# Patient Record
Sex: Female | Born: 1965 | Race: White | Hispanic: No | Marital: Single | Smoking: Former smoker
Health system: Southern US, Community
[De-identification: ages and names within clinical notes are randomized; demographics above are authoritative.]

## PROBLEM LIST (undated history)

## (undated) DIAGNOSIS — F419 Anxiety disorder, unspecified: Secondary | ICD-10-CM

## (undated) DIAGNOSIS — M47812 Spondylosis without myelopathy or radiculopathy, cervical region: Secondary | ICD-10-CM

## (undated) DIAGNOSIS — K2270 Barrett's esophagus without dysplasia: Secondary | ICD-10-CM

## (undated) DIAGNOSIS — Z8719 Personal history of other diseases of the digestive system: Secondary | ICD-10-CM

## (undated) DIAGNOSIS — K219 Gastro-esophageal reflux disease without esophagitis: Secondary | ICD-10-CM

## (undated) DIAGNOSIS — K5792 Diverticulitis of intestine, part unspecified, without perforation or abscess without bleeding: Secondary | ICD-10-CM

## (undated) DIAGNOSIS — J40 Bronchitis, not specified as acute or chronic: Secondary | ICD-10-CM

## (undated) HISTORY — PX: TUBAL LIGATION: SHX77

## (undated) HISTORY — PX: ABDOMINAL HYSTERECTOMY: SHX81

## (undated) HISTORY — PX: CARPAL TUNNEL RELEASE: SHX101

## (undated) HISTORY — PX: TONSILLECTOMY: SUR1361

## (undated) HISTORY — PX: BREAST SURGERY: SHX581

## (undated) HISTORY — PX: COLONOSCOPY WITH ESOPHAGOGASTRODUODENOSCOPY (EGD): SHX5779

---

## 2001-03-13 ENCOUNTER — Encounter: Payer: Self-pay | Admitting: Emergency Medicine

## 2001-03-13 ENCOUNTER — Emergency Department (HOSPITAL_COMMUNITY): Admission: EM | Admit: 2001-03-13 | Discharge: 2001-03-13 | Payer: Self-pay | Admitting: Emergency Medicine

## 2005-05-28 ENCOUNTER — Emergency Department (HOSPITAL_COMMUNITY): Admission: EM | Admit: 2005-05-28 | Discharge: 2005-05-28 | Payer: Self-pay | Admitting: Family Medicine

## 2013-02-09 ENCOUNTER — Emergency Department (HOSPITAL_COMMUNITY): Payer: No Typology Code available for payment source

## 2013-02-09 ENCOUNTER — Emergency Department (HOSPITAL_COMMUNITY)
Admission: EM | Admit: 2013-02-09 | Discharge: 2013-02-09 | Disposition: A | Discharge status: Home / Self Care | Payer: No Typology Code available for payment source | Attending: Emergency Medicine | Admitting: Emergency Medicine

## 2013-02-09 ENCOUNTER — Encounter (HOSPITAL_COMMUNITY): Payer: Self-pay | Admitting: Emergency Medicine

## 2013-02-09 DIAGNOSIS — S82009A Unspecified fracture of unspecified patella, initial encounter for closed fracture: Secondary | ICD-10-CM | POA: Insufficient documentation

## 2013-02-09 DIAGNOSIS — S0993XA Unspecified injury of face, initial encounter: Secondary | ICD-10-CM | POA: Insufficient documentation

## 2013-02-09 DIAGNOSIS — S298XXA Other specified injuries of thorax, initial encounter: Secondary | ICD-10-CM | POA: Insufficient documentation

## 2013-02-09 DIAGNOSIS — S82001A Unspecified fracture of right patella, initial encounter for closed fracture: Secondary | ICD-10-CM

## 2013-02-09 DIAGNOSIS — F17200 Nicotine dependence, unspecified, uncomplicated: Secondary | ICD-10-CM | POA: Insufficient documentation

## 2013-02-09 DIAGNOSIS — F411 Generalized anxiety disorder: Secondary | ICD-10-CM | POA: Insufficient documentation

## 2013-02-09 DIAGNOSIS — Y92410 Unspecified street and highway as the place of occurrence of the external cause: Secondary | ICD-10-CM | POA: Insufficient documentation

## 2013-02-09 DIAGNOSIS — Z8679 Personal history of other diseases of the circulatory system: Secondary | ICD-10-CM | POA: Insufficient documentation

## 2013-02-09 DIAGNOSIS — Y9389 Activity, other specified: Secondary | ICD-10-CM | POA: Insufficient documentation

## 2013-02-09 HISTORY — DX: Gastro-esophageal reflux disease without esophagitis: K21.9

## 2013-02-09 HISTORY — DX: Anxiety disorder, unspecified: F41.9

## 2013-02-09 MED ORDER — LORAZEPAM 1 MG PO TABS
1.0000 mg | ORAL_TABLET | Freq: Once | ORAL | Status: AC
  Administered 2013-02-09: 1 mg via ORAL
  Filled 2013-02-09: qty 1

## 2013-02-09 MED ORDER — KETOROLAC TROMETHAMINE 60 MG/2ML IM SOLN
60.0000 mg | Freq: Once | INTRAMUSCULAR | Status: AC
  Administered 2013-02-09: 60 mg via INTRAMUSCULAR
  Filled 2013-02-09: qty 2

## 2013-02-09 MED ORDER — HYDROMORPHONE HCL PF 1 MG/ML IJ SOLN
1.0000 mg | Freq: Once | INTRAMUSCULAR | Status: AC
  Administered 2013-02-09: 1 mg via INTRAMUSCULAR
  Filled 2013-02-09: qty 1

## 2013-02-09 MED ORDER — PROMETHAZINE HCL 25 MG PO TABS: 25.0000 mg | ORAL_TABLET | Freq: Four times a day (QID) | ORAL | Status: DC | PRN

## 2013-02-09 MED ORDER — OXYCODONE-ACETAMINOPHEN 5-325 MG PO TABS: 2.0000 | ORAL_TABLET | ORAL | Status: DC | PRN

## 2013-02-09 NOTE — ED Notes (Signed)
Sensation equal to bilateral extremities. Hand grips equal and strong. Denies any numbness tingling. States the back of her head hurts so bad it is almost numb.

## 2013-02-09 NOTE — ED Notes (Signed)
Ortho called for placement of knee immobilizer and crutches.
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
## 2013-02-09 NOTE — Progress Notes (Signed)
Orthopedic Tech Progress Note Patient Details:  Paula Casey [birth year] [id number]  Ortho Devices Type of Ortho Device: Knee Immobilizer;Crutches Ortho Device/Splint Location: RLE Ortho Device/Splint Interventions: Ordered;Application   Jennye Moccasin 02/09/2013, 11:14 PM
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
## 2013-02-09 NOTE — ED Notes (Signed)
C Spine collar removed by MD. Imaging results discussed with patient and family, verbalizes understanding, discharge instructions, and follow up.

## 2013-02-09 NOTE — ED Notes (Signed)
Pt. Crying out saying "I can't be alone, I can't be alone". Family brought back to see patient. Pt. Very anxious.
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
## 2015-05-13 ENCOUNTER — Emergency Department (INDEPENDENT_AMBULATORY_CARE_PROVIDER_SITE_OTHER)
Admission: EM | Admit: 2015-05-13 | Discharge: 2015-05-13 | Disposition: A | Discharge status: Home / Self Care | Payer: Medicaid Other | Source: Home / Self Care | Attending: Emergency Medicine | Admitting: Emergency Medicine

## 2015-05-13 ENCOUNTER — Encounter (HOSPITAL_COMMUNITY): Payer: Self-pay | Admitting: Emergency Medicine

## 2015-05-13 ENCOUNTER — Emergency Department (INDEPENDENT_AMBULATORY_CARE_PROVIDER_SITE_OTHER): Payer: Medicaid Other

## 2015-05-13 DIAGNOSIS — S93402A Sprain of unspecified ligament of left ankle, initial encounter: Secondary | ICD-10-CM

## 2015-05-13 DIAGNOSIS — S92102A Unspecified fracture of left talus, initial encounter for closed fracture: Secondary | ICD-10-CM | POA: Diagnosis not present

## 2015-05-13 MED ORDER — HYDROCODONE-ACETAMINOPHEN 5-325 MG PO TABS: 1.0000 | ORAL_TABLET | ORAL | Status: DC | PRN

## 2015-05-13 NOTE — ED Notes (Signed)
Reports she twisted her left ankle this am when walking down the bleachers during son's basketball game Sx include swelling and unable to bear wt... Reports she heard a "crack" Brought back in wheelchair A&O x4... No acute distress.

## 2015-05-13 NOTE — Discharge Instructions (Signed)
Ankle Sprain °An ankle sprain is an injury to the strong, fibrous tissues (ligaments) that hold the bones of your ankle joint together.  °CAUSES °An ankle sprain is usually caused by a fall or by twisting your ankle. Ankle sprains most commonly occur when you step on the outer edge of your foot, and your ankle turns inward. People who participate in sports are more prone to these types of injuries.  °SYMPTOMS  °· Pain in your ankle. The pain may be present at rest or only when you are trying to stand or walk. °· Swelling. °· Bruising. Bruising may develop immediately or within 1 to 2 days after your injury. °· Difficulty standing or walking, particularly when turning corners or changing directions. °DIAGNOSIS  °Your caregiver will ask you details about your injury and perform a physical exam of your ankle to determine if you have an ankle sprain. During the physical exam, your caregiver will press on and apply pressure to specific areas of your foot and ankle. Your caregiver will try to move your ankle in certain ways. An X-ray exam may be done to be sure a bone was not broken or a ligament did not separate from one of the bones in your ankle (avulsion fracture).  °TREATMENT  °Certain types of braces can help stabilize your ankle. Your caregiver can make a recommendation for this. Your caregiver may recommend the use of medicine for pain. If your sprain is severe, your caregiver may refer you to a surgeon who helps to restore function to parts of your skeletal system (orthopedist) or a physical therapist. °HOME CARE INSTRUCTIONS  °· Apply ice to your injury for 1-2 days or as directed by your caregiver. Applying ice helps to reduce inflammation and pain. °· Put ice in a plastic bag. °· Place a towel between your skin and the bag. °· Leave the ice on for 15-20 minutes at a time, every 2 hours while you are awake. °· Only take over-the-counter or prescription medicines for pain, discomfort, or fever as directed by  your caregiver. °· Elevate your injured ankle above the level of your heart as much as possible for 2-3 days. °· If your caregiver recommends crutches, use them as instructed. Gradually put weight on the affected ankle. Continue to use crutches or a cane until you can walk without feeling pain in your ankle. °· If you have a plaster splint, wear the splint as directed by your caregiver. Do not rest it on anything harder than a pillow for the first 24 hours. Do not put weight on it. Do not get it wet. You may take it off to take a shower or bath. °· You may have been given an elastic bandage to wear around your ankle to provide support. If the elastic bandage is too tight (you have numbness or tingling in your foot or your foot becomes cold and blue), adjust the bandage to make it comfortable. °· If you have an air splint, you may blow more air into it or let air out to make it more comfortable. You may take your splint off at night and before taking a shower or bath. Wiggle your toes in the splint several times per day to decrease swelling. °SEEK MEDICAL CARE IF:  °· You have rapidly increasing bruising or swelling. °· Your toes feel extremely cold or you lose feeling in your foot. °· Your pain is not relieved with medicine. °SEEK IMMEDIATE MEDICAL CARE IF: °· Your toes are numb or blue. °·   You have severe pain that is increasing. MAKE SURE YOU:   Understand these instructions.  Will watch your condition.  Will get help right away if you are not doing well or get worse.   This information is not intended to replace advice given to you by your health care provider. Make sure you discuss any questions you have with your health care provider.   Document Released: 05/13/2005 Document Revised: 06/03/2014 Document Reviewed: 05/25/2011 Elsevier Interactive Patient Education 2016 [employer]  Tarsal Fracture With Rehab The tarsal bones are a collection of 7 bones that collectively make up the back half of  the foot (hind foot). A tarsal fracture is a break in one of these bones. The 7 tarsal bones are the ankle bone (talus), heel bone (calcaneus), the 4 cuneiform bones, the cuboid, and the navicular. SYMPTOMS   Severe pain at the time of injury, which may persist for several weeks.  Tenderness, inflammation, or bruising (contusion) at the fracture site.  Swelling within the foot, causing numbness and paralysis, which are signs of nerve and blood vessel damage (uncommon). CAUSES  Tarsal fractures occur when a force is placed on the bone that is greater than the bone can withstand. Common causes of injury include:  Direct trauma (injury from impact) to the foot.  Awkwardly landing on the foot after jumping.  Twisting injury to the ankle and foot. RISK INCREASES WITH:  Contact sports (football, rugby, or lacrosse).  Jumping sports (basketball or volleyball).  Previous ankle or foot injury.  Poor strength and flexibility. PREVENTION  Warm up and stretch properly before activity.  Maintain physical fitness:  Strength, flexibility, and endurance.  Cardiovascular fitness (increases heart rate).  Protect the ankle and foot with taping, braces, or compression bandages.  Wear properly fitted shoes that are appropriate for the activity. PROGNOSIS  If treated properly, tarsal fractures can be expected to heal with non-surgical treatment. Occasionally, surgery is necessary to treat bone fragments that are out of alignment (displaced fracture). RELATED COMPLICATIONS   Failure of the fracture to heal (nonunion).  Healing of the fracture in a poor position (malunion).  Recurring symptoms that result in a chronic problem.  Prolonged healing time, if improperly treated or re-injured.  Bone death, due to poor circulation to the fracture site (rare).  Arthritis of the foot. TREATMENT  Treatment first involves the use of ice and medicine, to reduce pain and inflammation. If the bone  fragments are out of alignment (displaced fracture), then the bone must be immediately realigned (reduction) by a trained professional. Fractures that cannot be realigned by hand, or fractures where the bones poke out through the skin (open fracture), may require surgery to hold the fracture in place with screws, pins, and plates. Once in proper alignment, the foot and ankle must be kept still for a period of time to allow for healing. After this period, it is important to perform strengthening and stretching exercises to help regain strength and a full range of motion. These exercises may be completed at home or with a therapist.  MEDICATION   If pain medicine is necessary, nonsteroidal anti-inflammatory medications (aspirin and ibuprofen) or other minor pain relievers (acetaminophen) are often recommended.  Do not take pain medication for 7 days before surgery.  Prescription pain relievers may be given if your caregiver thinks they are necessary. Use only as directed and only as much as you need. COLD THERAPY  Cold treatment (icing) relieves pain and reduces inflammation. Cold treatment should be  applied for 10 to 15 minutes every 2 to 3 hours, and immediately after any activity that aggravates your symptoms. Use ice packs or an ice massage. SEEK MEDICAL CARE IF:   Treatment does not seem to help, or the condition worsens.  Any medicines produce negative side effects.  Any complications from surgery occur:  Pain, numbness, or coldness in the affected foot.  Discoloration beneath the toenails (blue or gray) of the affected foot.  Signs of infections (fever, pain, inflammation, redness, or persistent bleeding).

## 2015-05-13 NOTE — ED Provider Notes (Signed)
CSN: [id number]     Arrival date & time 05/13/15  1423 History   First MD Initiated Contact with Patient 05/13/15 1458     Chief Complaint  Patient presents with  . Ankle Injury   (Consider location/radiation/quality/duration/timing/severity/associated sxs/prior Treatment) HPI Comments: 49 year old female was walking up bleachers at a sports event and her left foot got cold in between the steps and twisted her foot/ankle. This occurred approximate 45 minutes prior to arrival. She is complaining of pain primarily to the lateral aspect of the left ankle where there is swelling and tenderness. Is also complaining of numbness and coldness in her foot.   Past Medical History  Diagnosis Date  . Anxiety   . GERD (gastroesophageal reflux disease)    Past Surgical History  Procedure Laterality Date  . Abdominal hysterectomy     No family history on file. Social History  Substance Use Topics  . Smoking status: Current Every Day Smoker -- 0.50 packs/day    Types: Cigarettes  . Smokeless tobacco: None  . Alcohol Use: No   OB History    No data available     Review of Systems  Constitutional: Positive for activity change. Negative for fever and chills.  HENT: Negative.   Respiratory: Negative.   Musculoskeletal: Positive for joint swelling and arthralgias. Negative for myalgias, neck pain and neck stiffness.       As per HPI  Skin: Negative for color change, pallor and rash.  Neurological: Positive for numbness.    Allergies  Codeine  Home Medications   Prior to Admission medications   Medication Sig Start Date End Date Taking? Authorizing Provider  amoxicillin-clavulanate (AUGMENTIN) 875-125 MG tablet Take 1 tablet by mouth 2 (two) times daily.   Yes Historical Provider, MD  BuPROPion HCl (WELLBUTRIN PO) Take by mouth.   Yes Historical Provider, MD  pantoprazole (PROTONIX) 20 MG tablet Take 20 mg by mouth daily.   Yes Historical Provider, MD  ALPRAZolam Prudy Feeler) 1 MG tablet  Take 1 mg by mouth 3 (three) times daily as needed for anxiety.    Historical Provider, MD  HYDROcodone-acetaminophen (NORCO/VICODIN) 5-325 MG tablet Take 1 tablet by mouth every 4 (four) hours as needed. 05/13/15   Hayden Rasmussen, NP  oxyCODONE-acetaminophen (PERCOCET) 5-325 MG per tablet Take 2 tablets by mouth every 4 (four) hours as needed for pain. 02/09/13   Donnetta Hutching, MD  promethazine (PHENERGAN) 25 MG tablet Take 1 tablet (25 mg total) by mouth every 6 (six) hours as needed for nausea. 02/09/13   Donnetta Hutching, MD   Meds Ordered and Administered this Visit  Medications - No data to display  BP 146/85 mmHg  Pulse 89  Temp(Src) 98.2 F (36.8 C) (Oral)  Resp 18  SpO2 96% No data found.   Physical Exam  Constitutional: She is oriented to person, place, and time. She appears well-developed and well-nourished. No distress.  Eyes: EOM are normal.  Neck: Normal range of motion. Neck supple.  Cardiovascular: Normal rate.   Pulmonary/Chest: Effort normal. No respiratory distress.  Musculoskeletal: She exhibits no edema.  Swelling and tenderness to the left lateral malleolus. Minimal tenderness to the proximal foot dorsal aspect. Tenderness over the anterior ankle. No tenderness to the metatarsals or toes. No discoloration. No deformity. Decreased dorsiflexion due to ankle pain. Pedal pulses 2+. Normal color and warmth.  Neurological: She is alert and oriented to person, place, and time. She exhibits normal muscle tone.  Skin: Skin is warm and dry.  Psychiatric:  She has a normal mood and affect. Her behavior is normal.  Nursing note and vitals reviewed.   ED Course  Procedures (including critical care time)  Labs Review Labs Reviewed - No data to display  Imaging Review Dg Ankle Complete Left  05/13/2015  CLINICAL DATA:  Fall from bleachers with lateral ankle pain, initial encounter EXAM: LEFT ANKLE COMPLETE - 3+ VIEW COMPARISON:  None. FINDINGS: Small avulsion fracture is noted  laterally with mild soft tissue swelling. It appears to arise from the lateral aspect of the talus. No other fractures are seen. IMPRESSION: Small avulsion from the lateral aspect of the talus. No other focal abnormality is noted. Electronically Signed   By: Alcide CleverMark  Lukens M.D.   On: 05/13/2015 15:38     Visual Acuity Review  Right Eye Distance:   Left Eye Distance:   Bilateral Distance:    Right Eye Near:   Left Eye Near:    Bilateral Near:         MDM   1. Ankle sprain, left, initial encounter   2. Talar fracture, left, closed, initial encounter    RICE Crutches Cam Walker [facility], call    Hayden Rasmussenavid Yanelly Cantrelle, NP 05/13/15 1607

## 2016-05-14 ENCOUNTER — Other Ambulatory Visit: Payer: Self-pay | Admitting: Neurosurgery

## 2016-05-22 ENCOUNTER — Inpatient Hospital Stay (HOSPITAL_COMMUNITY)
Admission: RE | Admit: 2016-05-22 | Discharge: 2016-05-22 | Disposition: A | Discharge status: Home / Self Care | Payer: Medicaid Other | Source: Ambulatory Visit

## 2016-05-22 NOTE — Pre-Procedure Instructions (Signed)
    Paula Casey  05/22/2016      [facility] - [place] - [address] [place] [address] Phone: [phone] Fax: [phone]    Your procedure is scheduled on Thursday, May 31, 2015  Report to [facility] Admitting at 10:15 A.M. ( per MD)  Call this number if you have problems the morning of surgery:  [phone]   Remember:  Do not eat food or drink liquids after midnight Wednesday, May 29, 2016  Take these medicines the morning of surgery with A SIP OF WATER : buPROPion (WELLBUTRIN XL),  pantoprazole (PROTONIX),  if needed: ALPRAZolam Prudy Feeler(XANAX) for anxiety Stop taking Aspirin, vitamins, fish oil and herbal medications. Do not take any NSAIDs ie: Ibuprofen, Advil, Naproxen, BC and Goody Powder or any medication containing Aspirin; stop Thursday, May 23, 2016  Do not wear jewelry, make-up or nail polish.  Do not wear lotions, powders, or perfumes, or deoderant.  Do not shave 48 hours prior to surgery.    Do not bring valuables to the hospital.  [facility] not responsible for any belongings or valuables.  Contacts, dentures or bridgework may not be worn into surgery.  Leave your suitcase in the car.  After surgery it may be brought to your room. For patients admitted to the hospital, discharge time will be determined by your treatment team. Patients discharged the day of surgery will not be allowed to drive home.  Special instructions: Shower the night before surgery and the morning of surgery with CHG. Please read over the following fact sheets that you were given. Pain Booklet, Coughing and Deep Breathing, MRSA Information and Surgical Site Infection Prevention

## 2016-05-23 ENCOUNTER — Encounter (HOSPITAL_COMMUNITY): Payer: Self-pay

## 2016-05-23 ENCOUNTER — Encounter (HOSPITAL_COMMUNITY)
Admission: RE | Admit: 2016-05-23 | Discharge: 2016-05-23 | Disposition: A | Discharge status: Home / Self Care | Payer: Medicaid Other | Source: Ambulatory Visit | Attending: Neurosurgery | Admitting: Neurosurgery

## 2016-05-23 DIAGNOSIS — Z01812 Encounter for preprocedural laboratory examination: Secondary | ICD-10-CM | POA: Diagnosis present

## 2016-05-23 HISTORY — DX: Bronchitis, not specified as acute or chronic: J40

## 2016-05-23 HISTORY — DX: Spondylosis without myelopathy or radiculopathy, cervical region: M47.812

## 2016-05-23 HISTORY — DX: Diverticulitis of intestine, part unspecified, without perforation or abscess without bleeding: K57.92

## 2016-05-23 HISTORY — DX: Personal history of other diseases of the digestive system: Z87.19

## 2016-05-23 HISTORY — DX: Barrett's esophagus without dysplasia: K22.70

## 2016-05-23 LAB — CBC
HEMATOCRIT: 36.6 % (ref 36.0–46.0)
HEMOGLOBIN: 12.1 g/dL (ref 12.0–15.0)
MCH: 30.4 pg (ref 26.0–34.0)
MCHC: 33.1 g/dL (ref 30.0–36.0)
MCV: 92 fL (ref 78.0–100.0)
Platelets: 344 10*3/uL (ref 150–400)
RBC: 3.98 MIL/uL (ref 3.87–5.11)
RDW: 12.9 % (ref 11.5–15.5)
WBC: 12.4 10*3/uL — ABNORMAL HIGH (ref 4.0–10.5)

## 2016-05-23 LAB — COMPREHENSIVE METABOLIC PANEL
ALT: 18 U/L (ref 14–54)
AST: 16 U/L (ref 15–41)
Albumin: 3.9 g/dL (ref 3.5–5.0)
Alkaline Phosphatase: 63 U/L (ref 38–126)
Anion gap: 12 (ref 5–15)
BUN: 11 mg/dL (ref 6–20)
CO2: 24 mmol/L (ref 22–32)
Calcium: 8.9 mg/dL (ref 8.9–10.3)
Chloride: 105 mmol/L (ref 101–111)
Creatinine, Ser: 0.84 mg/dL (ref 0.44–1.00)
GFR calc Af Amer: >60 mL/min (ref >60)
GFR calc non Af Amer: >60 mL/min (ref >60)
Glucose, Bld: 119 mg/dL — ABNORMAL HIGH (ref 65–99)
Potassium: 3.7 mmol/L (ref 3.5–5.1)
Sodium: 141 mmol/L (ref 135–145)
Total Bilirubin: 0.7 mg/dL (ref 0.3–1.2)
Total Protein: 6.7 g/dL (ref 6.5–8.1)

## 2016-05-23 LAB — TYPE AND SCREEN
ABO/RH(D): O POS
ANTIBODY SCREEN: NEGATIVE

## 2016-05-23 LAB — SURGICAL PCR SCREEN
MRSA, PCR: NEGATIVE
STAPHYLOCOCCUS AUREUS: NEGATIVE

## 2016-05-23 LAB — ABO/RH: ABO/RH(D): O POS

## 2016-05-23 NOTE — Progress Notes (Signed)
Pt denies SOB, chest pain, and being under the care of a cardiologist. Pt stated that a stress test was performed > 10 years ago but denies having a cardiac cath and echo. Revonda StandardAllison, PA, Anesthesia, advised that a CBC and CMP be drawn since pt takes Wellbutrin. Pt denies having any recent labs. Pt denies having an EKG and chest x ray within the last year.

## 2016-05-28 ENCOUNTER — Other Ambulatory Visit (HOSPITAL_COMMUNITY): Payer: Medicaid Other

## 2016-05-30 ENCOUNTER — Encounter (HOSPITAL_COMMUNITY)
Admission: RE | Disposition: A | Discharge status: Home / Self Care | Payer: Self-pay | Source: Ambulatory Visit | Attending: Neurosurgery

## 2016-05-30 ENCOUNTER — Encounter (HOSPITAL_COMMUNITY): Payer: Self-pay | Admitting: *Deleted

## 2016-05-30 ENCOUNTER — Observation Stay (HOSPITAL_COMMUNITY)
Admission: RE | Admit: 2016-05-30 | Discharge: 2016-05-31 | Disposition: A | Discharge status: Home / Self Care | Payer: Medicaid Other | Source: Ambulatory Visit | Attending: Neurosurgery | Admitting: Neurosurgery

## 2016-05-30 ENCOUNTER — Ambulatory Visit (HOSPITAL_COMMUNITY): Payer: Medicaid Other

## 2016-05-30 ENCOUNTER — Ambulatory Visit (HOSPITAL_COMMUNITY): Payer: Medicaid Other | Admitting: Critical Care Medicine

## 2016-05-30 ENCOUNTER — Ambulatory Visit (HOSPITAL_COMMUNITY): Payer: Medicaid Other | Admitting: Vascular Surgery

## 2016-05-30 DIAGNOSIS — F419 Anxiety disorder, unspecified: Secondary | ICD-10-CM | POA: Diagnosis not present

## 2016-05-30 DIAGNOSIS — Z885 Allergy status to narcotic agent status: Secondary | ICD-10-CM | POA: Insufficient documentation

## 2016-05-30 DIAGNOSIS — Z87891 Personal history of nicotine dependence: Secondary | ICD-10-CM | POA: Insufficient documentation

## 2016-05-30 DIAGNOSIS — M4722 Other spondylosis with radiculopathy, cervical region: Secondary | ICD-10-CM | POA: Diagnosis present

## 2016-05-30 DIAGNOSIS — M50122 Cervical disc disorder at C5-C6 level with radiculopathy: Secondary | ICD-10-CM | POA: Insufficient documentation

## 2016-05-30 DIAGNOSIS — Z90710 Acquired absence of both cervix and uterus: Secondary | ICD-10-CM | POA: Insufficient documentation

## 2016-05-30 DIAGNOSIS — K219 Gastro-esophageal reflux disease without esophagitis: Secondary | ICD-10-CM | POA: Insufficient documentation

## 2016-05-30 DIAGNOSIS — M1990 Unspecified osteoarthritis, unspecified site: Secondary | ICD-10-CM | POA: Insufficient documentation

## 2016-05-30 DIAGNOSIS — Z419 Encounter for procedure for purposes other than remedying health state, unspecified: Secondary | ICD-10-CM

## 2016-05-30 DIAGNOSIS — K2270 Barrett's esophagus without dysplasia: Secondary | ICD-10-CM | POA: Diagnosis not present

## 2016-05-30 HISTORY — PX: ANTERIOR CERVICAL DECOMP/DISCECTOMY FUSION: SHX1161

## 2016-05-30 SURGERY — ANTERIOR CERVICAL DECOMPRESSION/DISCECTOMY FUSION 1 LEVEL
Anesthesia: General

## 2016-05-30 MED ORDER — PHENYLEPHRINE HCL 10 MG/ML IJ SOLN
INTRAVENOUS | Status: DC | PRN
  Administered 2016-05-30: 40 ug/min via INTRAVENOUS
  Administered 2016-05-30: 20 ug/min via INTRAVENOUS

## 2016-05-30 MED ORDER — SUGAMMADEX SODIUM 200 MG/2ML IV SOLN
INTRAVENOUS | Status: DC | PRN
  Administered 2016-05-30: 150 mg via INTRAVENOUS

## 2016-05-30 MED ORDER — THROMBIN 5000 UNITS EX SOLR
CUTANEOUS | Status: DC | PRN
  Administered 2016-05-30: 14:00:00 via TOPICAL

## 2016-05-30 MED ORDER — SENNA 8.6 MG PO TABS
1.0000 | ORAL_TABLET | Freq: Two times a day (BID) | ORAL | Status: DC
  Administered 2016-05-30: 8.6 mg via ORAL
  Filled 2016-05-30: qty 1

## 2016-05-30 MED ORDER — BUPIVACAINE HCL 0.5 % IJ SOLN
INTRAMUSCULAR | Status: DC | PRN
  Administered 2016-05-30: 4.5 mL

## 2016-05-30 MED ORDER — MENTHOL 3 MG MT LOZG: 1.0000 | LOZENGE | OROMUCOSAL | Status: DC | PRN

## 2016-05-30 MED ORDER — THROMBIN 5000 UNITS EX SOLR
CUTANEOUS | Status: AC
  Filled 2016-05-30: qty 5000

## 2016-05-30 MED ORDER — BISACODYL 10 MG RE SUPP: 10.0000 mg | Freq: Every day | RECTAL | Status: DC | PRN

## 2016-05-30 MED ORDER — DEXAMETHASONE SODIUM PHOSPHATE 10 MG/ML IJ SOLN
INTRAMUSCULAR | Status: AC
  Filled 2016-05-30: qty 1

## 2016-05-30 MED ORDER — CEFAZOLIN SODIUM-DEXTROSE 2-4 GM/100ML-% IV SOLN
2.0000 g | INTRAVENOUS | Status: AC
  Administered 2016-05-30: 2 g via INTRAVENOUS
  Filled 2016-05-30: qty 100

## 2016-05-30 MED ORDER — DIPHENHYDRAMINE HCL 50 MG/ML IJ SOLN
INTRAMUSCULAR | Status: AC
  Filled 2016-05-30: qty 1

## 2016-05-30 MED ORDER — CEFAZOLIN SODIUM-DEXTROSE 2-4 GM/100ML-% IV SOLN
2.0000 g | Freq: Three times a day (TID) | INTRAVENOUS | Status: AC
  Administered 2016-05-30 – 2016-05-31 (×2): 2 g via INTRAVENOUS
  Filled 2016-05-30 (×2): qty 100

## 2016-05-30 MED ORDER — FENTANYL CITRATE (PF) 100 MCG/2ML IJ SOLN
INTRAMUSCULAR | Status: DC | PRN
  Administered 2016-05-30 (×4): 50 ug via INTRAVENOUS
  Administered 2016-05-30: 100 ug via INTRAVENOUS

## 2016-05-30 MED ORDER — FLEET ENEMA 7-19 GM/118ML RE ENEM: 1.0000 | ENEMA | Freq: Once | RECTAL | Status: DC | PRN

## 2016-05-30 MED ORDER — BUPIVACAINE HCL (PF) 0.5 % IJ SOLN
INTRAMUSCULAR | Status: AC
  Filled 2016-05-30: qty 30

## 2016-05-30 MED ORDER — SODIUM CHLORIDE 0.9 % IV SOLN: INTRAVENOUS | Status: DC

## 2016-05-30 MED ORDER — LACTATED RINGERS IV SOLN
INTRAVENOUS | Status: DC
  Administered 2016-05-30: 09:00:00 via INTRAVENOUS

## 2016-05-30 MED ORDER — HEMOSTATIC AGENTS (NO CHARGE) OPTIME
TOPICAL | Status: DC | PRN
  Administered 2016-05-30: 1 via TOPICAL

## 2016-05-30 MED ORDER — PANTOPRAZOLE SODIUM 40 MG PO TBEC
40.0000 mg | DELAYED_RELEASE_TABLET | Freq: Every day | ORAL | Status: DC
  Administered 2016-05-30: 40 mg via ORAL
  Filled 2016-05-30: qty 1

## 2016-05-30 MED ORDER — FENTANYL CITRATE (PF) 100 MCG/2ML IJ SOLN
INTRAMUSCULAR | Status: AC
  Filled 2016-05-30: qty 2

## 2016-05-30 MED ORDER — MIDAZOLAM HCL 2 MG/2ML IJ SOLN
INTRAMUSCULAR | Status: AC
  Filled 2016-05-30: qty 2

## 2016-05-30 MED ORDER — ONDANSETRON HCL 4 MG/2ML IJ SOLN
INTRAMUSCULAR | Status: DC | PRN
  Administered 2016-05-30: 4 mg via INTRAVENOUS

## 2016-05-30 MED ORDER — PROPOFOL 10 MG/ML IV BOLUS
INTRAVENOUS | Status: AC
  Filled 2016-05-30: qty 20

## 2016-05-30 MED ORDER — SODIUM CHLORIDE 0.9% FLUSH: 3.0000 mL | Freq: Two times a day (BID) | INTRAVENOUS | Status: DC

## 2016-05-30 MED ORDER — LIDOCAINE-EPINEPHRINE (PF) 2 %-1:200000 IJ SOLN
INTRAMUSCULAR | Status: DC | PRN
  Administered 2016-05-30: 4.5 mL via INTRADERMAL

## 2016-05-30 MED ORDER — CHLORHEXIDINE GLUCONATE CLOTH 2 % EX PADS: 6.0000 | MEDICATED_PAD | Freq: Once | CUTANEOUS | Status: DC

## 2016-05-30 MED ORDER — OXYCODONE-ACETAMINOPHEN 5-325 MG PO TABS: 1.0000 | ORAL_TABLET | ORAL | 0 refills | Status: AC | PRN

## 2016-05-30 MED ORDER — DEXAMETHASONE SODIUM PHOSPHATE 10 MG/ML IJ SOLN
INTRAMUSCULAR | Status: DC | PRN
  Administered 2016-05-30: 10 mg via INTRAVENOUS

## 2016-05-30 MED ORDER — DOCUSATE SODIUM 100 MG PO CAPS
100.0000 mg | ORAL_CAPSULE | Freq: Two times a day (BID) | ORAL | Status: DC
  Administered 2016-05-30: 100 mg via ORAL
  Filled 2016-05-30: qty 1

## 2016-05-30 MED ORDER — THROMBIN 5000 UNITS EX SOLR
CUTANEOUS | Status: AC
  Filled 2016-05-30: qty 10000

## 2016-05-30 MED ORDER — FAMOTIDINE 20 MG PO TABS
20.0000 mg | ORAL_TABLET | Freq: Every day | ORAL | Status: DC
  Administered 2016-05-30: 20 mg via ORAL
  Filled 2016-05-30: qty 1

## 2016-05-30 MED ORDER — PHENYLEPHRINE 40 MCG/ML (10ML) SYRINGE FOR IV PUSH (FOR BLOOD PRESSURE SUPPORT)
PREFILLED_SYRINGE | INTRAVENOUS | Status: DC | PRN
  Administered 2016-05-30: 80 ug via INTRAVENOUS
  Administered 2016-05-30: 160 ug via INTRAVENOUS
  Administered 2016-05-30: 120 ug via INTRAVENOUS

## 2016-05-30 MED ORDER — METHOCARBAMOL 500 MG PO TABS: 500.0000 mg | ORAL_TABLET | Freq: Four times a day (QID) | ORAL | 0 refills | Status: AC | PRN

## 2016-05-30 MED ORDER — ROCURONIUM BROMIDE 50 MG/5ML IV SOSY
PREFILLED_SYRINGE | INTRAVENOUS | Status: DC | PRN
  Administered 2016-05-30: 50 mg via INTRAVENOUS
  Administered 2016-05-30: 10 mg via INTRAVENOUS

## 2016-05-30 MED ORDER — ROCURONIUM BROMIDE 100 MG/10ML IV SOLN: INTRAVENOUS | Status: DC | PRN

## 2016-05-30 MED ORDER — MORPHINE SULFATE (PF) 4 MG/ML IV SOLN
1.0000 mg | INTRAVENOUS | Status: DC | PRN
  Administered 2016-05-30: 4 mg via INTRAVENOUS
  Filled 2016-05-30: qty 1

## 2016-05-30 MED ORDER — PROPOFOL 10 MG/ML IV BOLUS
INTRAVENOUS | Status: DC | PRN
  Administered 2016-05-30: 150 mg via INTRAVENOUS

## 2016-05-30 MED ORDER — FENTANYL CITRATE (PF) 100 MCG/2ML IJ SOLN
INTRAMUSCULAR | Status: AC
  Filled 2016-05-30: qty 4

## 2016-05-30 MED ORDER — THROMBIN 5000 UNITS EX SOLR
CUTANEOUS | Status: DC | PRN
  Administered 2016-05-30 (×2): 5000 [IU] via TOPICAL

## 2016-05-30 MED ORDER — 0.9 % SODIUM CHLORIDE (POUR BTL) OPTIME
TOPICAL | Status: DC | PRN
  Administered 2016-05-30: 1000 mL

## 2016-05-30 MED ORDER — HYDROMORPHONE HCL 1 MG/ML IJ SOLN
INTRAMUSCULAR | Status: AC
  Administered 2016-05-30: 0.5 mg via INTRAVENOUS
  Filled 2016-05-30: qty 1

## 2016-05-30 MED ORDER — POLYETHYLENE GLYCOL 3350 17 G PO PACK: 17.0000 g | PACK | Freq: Every day | ORAL | Status: DC | PRN

## 2016-05-30 MED ORDER — ALPRAZOLAM 0.5 MG PO TABS
1.0000 mg | ORAL_TABLET | Freq: Three times a day (TID) | ORAL | Status: DC | PRN
  Administered 2016-05-30: 1 mg via ORAL
  Filled 2016-05-30: qty 2

## 2016-05-30 MED ORDER — ACETAMINOPHEN 325 MG PO TABS: 650.0000 mg | ORAL_TABLET | ORAL | Status: DC | PRN

## 2016-05-30 MED ORDER — BUPROPION HCL ER (XL) 300 MG PO TB24: 300.0000 mg | ORAL_TABLET | Freq: Every day | ORAL | Status: DC

## 2016-05-30 MED ORDER — METHOCARBAMOL 500 MG PO TABS
500.0000 mg | ORAL_TABLET | Freq: Four times a day (QID) | ORAL | Status: DC | PRN
  Administered 2016-05-30 – 2016-05-31 (×3): 500 mg via ORAL
  Filled 2016-05-30 (×3): qty 1

## 2016-05-30 MED ORDER — ROCURONIUM BROMIDE 50 MG/5ML IV SOSY
PREFILLED_SYRINGE | INTRAVENOUS | Status: AC
  Filled 2016-05-30: qty 5

## 2016-05-30 MED ORDER — CALCIUM CARBONATE-VITAMIN D 500-200 MG-UNIT PO TABS: 1.0000 | ORAL_TABLET | Freq: Every day | ORAL | Status: DC

## 2016-05-30 MED ORDER — SUGAMMADEX SODIUM 200 MG/2ML IV SOLN
INTRAVENOUS | Status: AC
  Filled 2016-05-30: qty 2

## 2016-05-30 MED ORDER — PANTOPRAZOLE SODIUM 40 MG PO TBEC: 40.0000 mg | DELAYED_RELEASE_TABLET | Freq: Every day | ORAL | Status: DC

## 2016-05-30 MED ORDER — ONDANSETRON HCL 4 MG/2ML IJ SOLN: 4.0000 mg | INTRAMUSCULAR | Status: DC | PRN

## 2016-05-30 MED ORDER — ACETAMINOPHEN 650 MG RE SUPP: 650.0000 mg | RECTAL | Status: DC | PRN

## 2016-05-30 MED ORDER — ONDANSETRON HCL 4 MG/2ML IJ SOLN
INTRAMUSCULAR | Status: AC
  Filled 2016-05-30: qty 2

## 2016-05-30 MED ORDER — PROMETHAZINE HCL 25 MG/ML IJ SOLN
6.2500 mg | INTRAMUSCULAR | Status: DC | PRN
  Administered 2016-05-30: 6.25 mg via INTRAVENOUS

## 2016-05-30 MED ORDER — LIDOCAINE HCL (CARDIAC) 20 MG/ML IV SOLN
INTRAVENOUS | Status: DC | PRN
  Administered 2016-05-30: 60 mg via INTRAVENOUS

## 2016-05-30 MED ORDER — HYDROMORPHONE HCL 1 MG/ML IJ SOLN
0.2500 mg | INTRAMUSCULAR | Status: DC | PRN
  Administered 2016-05-30 (×2): 0.5 mg via INTRAVENOUS

## 2016-05-30 MED ORDER — METHOCARBAMOL 1000 MG/10ML IJ SOLN
500.0000 mg | Freq: Four times a day (QID) | INTRAVENOUS | Status: DC | PRN
  Filled 2016-05-30: qty 5

## 2016-05-30 MED ORDER — SODIUM CHLORIDE 0.9% FLUSH: 3.0000 mL | INTRAVENOUS | Status: DC | PRN

## 2016-05-30 MED ORDER — MORPHINE SULFATE (PF) 2 MG/ML IV SOLN: 1.0000 mg | INTRAVENOUS | Status: DC | PRN

## 2016-05-30 MED ORDER — PANTOPRAZOLE SODIUM 40 MG IV SOLR: 40.0000 mg | Freq: Every day | INTRAVENOUS | Status: DC

## 2016-05-30 MED ORDER — PHENOL 1.4 % MT LIQD: 1.0000 | OROMUCOSAL | Status: DC | PRN

## 2016-05-30 MED ORDER — PROMETHAZINE HCL 25 MG/ML IJ SOLN
INTRAMUSCULAR | Status: AC
  Administered 2016-05-30: 6.25 mg via INTRAVENOUS
  Filled 2016-05-30: qty 1

## 2016-05-30 MED ORDER — MIDAZOLAM HCL 5 MG/5ML IJ SOLN
INTRAMUSCULAR | Status: DC | PRN
  Administered 2016-05-30: 2 mg via INTRAVENOUS

## 2016-05-30 MED ORDER — SODIUM CHLORIDE 0.9 % IR SOLN
Status: DC | PRN
  Administered 2016-05-30: 14:00:00

## 2016-05-30 MED ORDER — LIDOCAINE-EPINEPHRINE (PF) 2 %-1:200000 IJ SOLN
INTRAMUSCULAR | Status: AC
  Filled 2016-05-30: qty 20

## 2016-05-30 MED ORDER — OXYCODONE-ACETAMINOPHEN 5-325 MG PO TABS
1.0000 | ORAL_TABLET | ORAL | Status: DC | PRN
  Administered 2016-05-30 – 2016-05-31 (×4): 2 via ORAL
  Filled 2016-05-30 (×4): qty 2

## 2016-05-30 MED ORDER — PHENYLEPHRINE 40 MCG/ML (10ML) SYRINGE FOR IV PUSH (FOR BLOOD PRESSURE SUPPORT)
PREFILLED_SYRINGE | INTRAVENOUS | Status: AC
  Filled 2016-05-30: qty 10

## 2016-05-30 SURGICAL SUPPLY — 65 items
ADH SKN CLS APL DERMABOND .7 (GAUZE/BANDAGES/DRESSINGS) ×1
APL SKNCLS STERI-STRIP NONHPOA (GAUZE/BANDAGES/DRESSINGS)
BAG DECANTER FOR FLEXI CONT (MISCELLANEOUS) ×2 IMPLANT
BENZOIN TINCTURE PRP APPL 2/3 (GAUZE/BANDAGES/DRESSINGS) IMPLANT
BLADE CLIPPER SURG (BLADE) IMPLANT
BLADE SURG 11 STRL SS (BLADE) ×2 IMPLANT
BLADE ULTRA TIP 2M (BLADE) IMPLANT
BUR MATCHSTICK NEURO 3.0 LAGG (BURR) ×2 IMPLANT
CAGE EXPANSE 8X6X6 (Cage) ×1 IMPLANT
CAGE PEEK 7X14X11 (Cage) ×2 IMPLANT
CAGE SPNL 11X14X7XRADOPQ (Cage) IMPLANT
CANISTER SUCT 3000ML PPV (MISCELLANEOUS) ×2 IMPLANT
CARTRIDGE OIL MAESTRO DRILL (MISCELLANEOUS) ×1 IMPLANT
DECANTER SPIKE VIAL GLASS SM (MISCELLANEOUS) ×3 IMPLANT
DERMABOND ADVANCED (GAUZE/BANDAGES/DRESSINGS) ×1
DERMABOND ADVANCED .7 DNX12 (GAUZE/BANDAGES/DRESSINGS) ×1 IMPLANT
DIFFUSER DRILL AIR PNEUMATIC (MISCELLANEOUS) ×2 IMPLANT
DRAIN CHANNEL 10M FLAT 3/4 FLT (DRAIN) IMPLANT
DRAPE C-ARM 42X72 X-RAY (DRAPES) ×6 IMPLANT
DRAPE HALF SHEET 40X57 (DRAPES) IMPLANT
DRAPE LAPAROTOMY 100X72 PEDS (DRAPES) ×2 IMPLANT
DRAPE MICROSCOPE LEICA (MISCELLANEOUS) ×2 IMPLANT
DRAPE POUCH INSTRU U-SHP 10X18 (DRAPES) ×2 IMPLANT
DRSG OPSITE 4X5.5 SM (GAUZE/BANDAGES/DRESSINGS) ×4 IMPLANT
DRSG OPSITE POSTOP 4X6 (GAUZE/BANDAGES/DRESSINGS) ×1 IMPLANT
DURAPREP 6ML APPLICATOR 50/CS (WOUND CARE) ×2 IMPLANT
ELECT COATED BLADE 2.86 ST (ELECTRODE) ×2 IMPLANT
ELECT REM PT RETURN 9FT ADLT (ELECTROSURGICAL) ×2
ELECTRODE REM PT RTRN 9FT ADLT (ELECTROSURGICAL) ×1 IMPLANT
EVACUATOR SILICONE 100CC (DRAIN) IMPLANT
GAUZE SPONGE 4X4 16PLY XRAY LF (GAUZE/BANDAGES/DRESSINGS) IMPLANT
GLOVE BIOGEL PI IND STRL 7.5 (GLOVE) ×1 IMPLANT
GLOVE BIOGEL PI INDICATOR 7.5 (GLOVE) ×1
GLOVE ECLIPSE 7.0 STRL STRAW (GLOVE) ×3 IMPLANT
GLOVE EXAM NITRILE LRG STRL (GLOVE) IMPLANT
GLOVE EXAM NITRILE XL STR (GLOVE) IMPLANT
GLOVE EXAM NITRILE XS STR PU (GLOVE) IMPLANT
GOWN STRL REUS W/ TWL LRG LVL3 (GOWN DISPOSABLE) ×2 IMPLANT
GOWN STRL REUS W/ TWL XL LVL3 (GOWN DISPOSABLE) IMPLANT
GOWN STRL REUS W/TWL 2XL LVL3 (GOWN DISPOSABLE) IMPLANT
GOWN STRL REUS W/TWL LRG LVL3 (GOWN DISPOSABLE) ×4
GOWN STRL REUS W/TWL XL LVL3 (GOWN DISPOSABLE)
HEMOSTAT POWDER KIT SURGIFOAM (HEMOSTASIS) ×2 IMPLANT
KIT BASIN OR (CUSTOM PROCEDURE TRAY) ×2 IMPLANT
KIT ROOM TURNOVER OR (KITS) ×2 IMPLANT
NDL HYPO 25X1 1.5 SAFETY (NEEDLE) ×1 IMPLANT
NDL SPNL 22GX3.5 QUINCKE BK (NEEDLE) ×1 IMPLANT
NEEDLE HYPO 25X1 1.5 SAFETY (NEEDLE) ×2 IMPLANT
NEEDLE SPNL 22GX3.5 QUINCKE BK (NEEDLE) ×2 IMPLANT
NS IRRIG 1000ML POUR BTL (IV SOLUTION) ×2 IMPLANT
OIL CARTRIDGE MAESTRO DRILL (MISCELLANEOUS) ×2
PACK LAMINECTOMY NEURO (CUSTOM PROCEDURE TRAY) ×2 IMPLANT
PAD ARMBOARD 7.5X6 YLW CONV (MISCELLANEOUS) ×6 IMPLANT
PLATE ELITE VISION 25MM (Plate) ×1 IMPLANT
RUBBERBAND STERILE (MISCELLANEOUS) ×4 IMPLANT
SCREW SELF TAP VAR 4.0X13 (Screw) ×4 IMPLANT
SPONGE INTESTINAL PEANUT (DISPOSABLE) ×2 IMPLANT
SPONGE SURGIFOAM ABS GEL SZ50 (HEMOSTASIS) ×2 IMPLANT
STRIP CLOSURE SKIN 1/2X4 (GAUZE/BANDAGES/DRESSINGS) IMPLANT
SUT ETHILON 3 0 FSL (SUTURE) IMPLANT
SUT VIC AB 3-0 SH 8-18 (SUTURE) ×2 IMPLANT
SUT VICRYL 3-0 RB1 18 ABS (SUTURE) ×4 IMPLANT
TOWEL OR 17X24 6PK STRL BLUE (TOWEL DISPOSABLE) ×2 IMPLANT
TOWEL OR 17X26 10 PK STRL BLUE (TOWEL DISPOSABLE) ×2 IMPLANT
WATER STERILE IRR 1000ML POUR (IV SOLUTION) ×2 IMPLANT

## 2016-05-30 NOTE — Transfer of Care (Signed)
Immediate Anesthesia Transfer of Care Note  Patient: Paula Casey  Procedure(s) Performed: Procedure(s) with comments: ANTERIOR CERVICAL DECOMPRESSION/DISCECTOMY FUSION  CERVICAL FIVE- CERVICAL SIX (N/A) - ANTERIOR CERVICAL DECOMPRESSION/DISCECTOMY FUSION  C5-C6  Patient Location: PACU  Anesthesia Type:General  Level of Consciousness: awake, alert  and oriented  Airway & Oxygen Therapy: Patient Spontanous Breathing and Patient connected to nasal cannula oxygen  Post-op Assessment: Report given to RN, Post -op Vital signs reviewed and stable and Patient moving all extremities X 4  Post vital signs: Reviewed and stable  Last Vitals:  Vitals:   05/30/16 0858  BP: 120/84  Pulse: 94  Resp: 20  Temp: 36.6 C    Last Pain:  Vitals:   05/30/16 0858  TempSrc: Oral      Patients Stated Pain Goal: 2 (05/30/16 0859)  Complications: No apparent anesthesia complications

## 2016-05-30 NOTE — Anesthesia Procedure Notes (Signed)
Procedure Name: Intubation Date/Time: 05/30/2016 12:39 PM Performed by: Merrilyn Puma B Pre-anesthesia Checklist: Patient identified, Emergency Drugs available, Suction available, Timeout performed and Patient being monitored Patient Re-evaluated:Patient Re-evaluated prior to inductionOxygen Delivery Method: Circle system utilized Preoxygenation: Pre-oxygenation with 100% oxygen Intubation Type: IV induction Ventilation: Mask ventilation without difficulty and Oral airway inserted - appropriate to patient size Laryngoscope Size: Mac and 3 Grade View: Grade II Tube type: Oral Tube size: 7.0 mm Number of attempts: 1 Airway Equipment and Method: Stylet Placement Confirmation: ETT inserted through vocal cords under direct vision,  positive ETCO2,  CO2 detector and breath sounds checked- equal and bilateral Secured at: 21 cm Tube secured with: Tape Dental Injury: Teeth and Oropharynx as per pre-operative assessment

## 2016-05-30 NOTE — Discharge Instructions (Signed)
Wound Care  You may remove outer bandage after 2 days and shower.  Keep incision open to air. Do not put any creams, lotions, or ointments on incision.  Activity Walk as much as possible. Walk each and every day, increasing distance each day. No lifting greater than 5 lbs.  Avoid excessive neck motion. No driving for 2 weeks; may ride as a passenger locally.  Diet Resume your normal diet.  Return to Work Will be discussed at you follow up appointment. Call Your Doctor If Any of These Occur Redness, drainage, or swelling at the wound.  Temperature greater than 101 degrees. Severe pain not relieved by pain medication. Increased difficulty swallowing.  Incision starts to come apart.  Follow Up Appt Call today and ask for appointment in 1-2 weeks [phone] or for problems.  If you have any hardware placed in your spine, you will need an x-ray before your appointment.

## 2016-05-30 NOTE — Discharge Summary (Signed)
  Physician Discharge Summary  Patient ID: Paula Casey MRN: [id number] DOB/AGE: March 11, 1966 51 y.o.  Admit date: 05/30/2016 Discharge date: 05/30/2016  Admission Diagnoses: Cervical spondylosis with radiculopathy, C5-6  Discharge Diagnoses: Same Active Problems:   Cervical spondylosis with radiculopathy   Discharged Condition: Stable  Hospital Course:  Paula Casey is a 51 y.o. female electively admitted after ACDF. Postoperatively, the patient was at neurologic baseline. She was tolerating diet, ambulating independently, voiding normally, with pain controlled with oral medication.  Treatments: Surgery - ACDF C5-6  Discharge Exam: Blood pressure 130/86, pulse 86, temperature 97.9 F (36.6 C), resp. rate 11, weight 71.7 kg (158 lb), SpO2 93 %. Awake, alert, oriented Speech fluent, appropriate CN grossly intact 5/5 BUE/BLE Wound c/d/i  Follow-up: Follow-up in my office [facility] [phone]) in 2-3 weeks  Disposition: 01-Home or Self Care   Allergies as of 05/30/2016      Reactions   Codeine Nausea And Vomiting      Medication List    STOP taking these medications   predniSONE 10 MG tablet Commonly known as:  DELTASONE     TAKE these medications   ALPRAZolam 1 MG tablet Commonly known as:  XANAX Take 1 mg by mouth 3 (three) times daily as needed for anxiety.   buPROPion 300 MG 24 hr tablet Commonly known as:  WELLBUTRIN XL Take 300 mg by mouth daily.   calcium-vitamin D 500-200 MG-UNIT tablet Commonly known as:  OSCAL WITH D Take 1 tablet by mouth daily.   ibuprofen 200 MG tablet Commonly known as:  ADVIL,MOTRIN Take 800 mg by mouth every 8 (eight) hours as needed (for pain/headache.).   methocarbamol 500 MG tablet Commonly known as:  ROBAXIN Take 1 tablet (500 mg total) by mouth every 6 (six) hours as needed for muscle spasms.   oxyCODONE-acetaminophen 5-325 MG tablet Commonly known as:  PERCOCET/ROXICET Take 1-2  tablets by mouth every 4 (four) hours as needed for moderate pain.   pantoprazole 40 MG tablet Commonly known as:  PROTONIX Take 40 mg by mouth daily.   ranitidine 150 MG tablet Commonly known as:  ZANTAC Take 150 mg by mouth at bedtime.      Follow-up Information    Marcelene Weidemann, C, MD Follow up in 2 week(s).   Specialty:  Neurosurgery Contact information: [address] [place] [address] [phone]           Signed: Lisbeth RenshawUNDKUMAR, Nakeysha Pasqual, Salena SanerC 05/30/2016, 3:04 PM

## 2016-05-30 NOTE — H&P (Signed)
CC:  Neck and right arm pain  HPI: Paula Casey is a 51 year old woman I'm seeing for the above complaint. She comes in with a primary complaint of months of right-sided arm pain. She says the pain started without any identifiable inciting event. She describes pulling, achy type pain which extends from the right side of her neck, through the shoulder and arm, and into the lateral aspect of her forearm and into the right thumb. She says the pain is severe enough that she has a hard time even holding a toothbrush or hair brush. She is unable to wash her own hair in the shower. She has adopted her 2 grandchildren, and has found it very difficult to care for them because of the pain. She says she has noted a little bit of weakness in that arm, especially dropping things that she is holding. She has not had severe pain in the left arm, but does report a little bit of pain in the left arm and a minimal amount of numbness in the left thumb. She has not noted any imbalance or falls. She has not had any changes in bowel or bladder function. Initially when the pain started, she was given a Medrol Dosepak which did not really provide any relief. She has also been taking Tylenol, 800 mg of Motrin, as well as Aleve with minimal relief.   PMH: Past Medical History:  Diagnosis Date  . Anxiety   . Barrett's esophagus   . Bronchitis   . Diverticulitis   . GERD (gastroesophageal reflux disease)   . History of hiatal hernia   . Spondylosis of cervical spine    with radiculopathy    PSH: Past Surgical History:  Procedure Laterality Date  . ABDOMINAL HYSTERECTOMY    . BREAST SURGERY     B/L breast ductectomy  . CARPAL TUNNEL RELEASE     right hand and  trigger thumb  . COLONOSCOPY WITH ESOPHAGOGASTRODUODENOSCOPY (EGD)     biopsy and polypectomy  . TONSILLECTOMY    . TUBAL LIGATION      SH: Social History  Substance Use Topics  . Smoking status: Former Smoker    Packs/day: 0.50    Types: Cigarettes     Quit date: 10/25/2015  . Smokeless tobacco: Never Used  . Alcohol use Yes     Comment: rare    MEDS: Prior to Admission medications   Medication Sig Start Date End Date Taking? Authorizing Provider  ALPRAZolam Prudy Feeler) 1 MG tablet Take 1 mg by mouth 3 (three) times daily as needed for anxiety.   Yes Historical Provider, MD  buPROPion (WELLBUTRIN XL) 300 MG 24 hr tablet Take 300 mg by mouth daily.   Yes Historical Provider, MD  calcium-vitamin D (OSCAL WITH D) 500-200 MG-UNIT tablet Take 1 tablet by mouth daily.   Yes Historical Provider, MD  ibuprofen (ADVIL,MOTRIN) 200 MG tablet Take 800 mg by mouth every 8 (eight) hours as needed (for pain/headache.).   Yes Historical Provider, MD  pantoprazole (PROTONIX) 40 MG tablet Take 40 mg by mouth daily.   Yes Historical Provider, MD  predniSONE (DELTASONE) 10 MG tablet Take 10 mg by mouth daily with breakfast. Take these number of tablets on consecutive days:6-6-5-5-4-4-3-3-2-2-1-1 then discontinue.   Yes Historical Provider, MD  ranitidine (ZANTAC) 150 MG tablet Take 150 mg by mouth at bedtime.   Yes Historical Provider, MD    ALLERGY: Allergies  Allergen Reactions  . Codeine Nausea And Vomiting    ROS: ROS  NEUROLOGIC EXAM: Awake, alert, oriented Memory and concentration grossly intact Speech fluent, appropriate CN grossly intact Motor exam: Upper Extremities Deltoid Bicep Tricep Grip  Right 5/5 5/5 5/5 5/5  Left 5/5 5/5 5/5 5/5   Lower Extremity IP Quad PF DF EHL  Right 5/5 5/5 5/5 5/5 5/5  Left 5/5 5/5 5/5 5/5 5/5   Sensation grossly intact to LT  IMGAING: MRI of the cervical spine was reviewed. This demonstrates straightening of the normal cervical lordosis. Primary pathologies at C5 C6 where there is a right eccentric lateral recess/foraminal disc protrusion and resultant foraminal stenosis.  IMPRESSION: 51 year old woman with right C6 radiculopathy related to right eccentric C5 C6 disc herniation.  PLAN: Proced  with C5 C6 ACDF   I reviewed the MRI images and findings with the patient and her mother in the office. Treatment options at this point were discussed including continued conservative treatment which may include physical therapy and injection therapy. Surgical decompression and fusion was also discussed. The risks of surgery were discussed in detail with the patient which include but are not limited to spinal cord injury which may result in hand, leg, and bowel dysfunction, postoperative dysphagia, dysphonia, neck hematoma, or subsequent surgery for epidural hematoma. The risk of CSF leak was also discussed. In addition, I explained to him that after spinal fusion surgery, there is a risk of adjacent level disease requiring future surgical intervention. The possibility of continued/worsening pain after surgery was also discussed. The general risks of anesthesia were also reviewed including heart attack, stroke, and DVT/PE.   The patient understood our discussion as well as the risks of the surgery and is willing to proceed. All questions were answered.

## 2016-05-30 NOTE — Op Note (Signed)
PREOP DIAGNOSIS: Cervical spondylosis with radiculopathy, C5-6  POSTOP DIAGNOSIS: Same  PROCEDURE: 1. Discectomy at C5-6 for decompression of spinal cord and exiting nerve roots  2. Placement of intervertebral biomechanical device Medtronic PTC PEEK 819m lordotic 3. Placement of anterior instrumentation consisting of interbody plate and screws - Medtronic Zevo plate, 164mscrews  4. Use of morselized bone allograft  5. Arthrodesis C5-6, anterior interbody technique  6. Use of intraoperative microscope  SURGEON: Dr. NeConsuella LoseMD  ASSISTANT: Dr. JeNewman PiesMD  ANESTHESIA: General Endotracheal  EBL: 50cc  SPECIMENS: None  DRAINS: None  COMPLICATIONS: None immediate  CONDITION: Hemodynamically stable to PACU  HISTORY: Paula ISHIKAWAs a [age].o. y.o. female who initially presented to the outpatient clinic with signs and symptoms consistent with right C6 radiculopathy including right neck and arm pain and numbness. MRI demonstrated right C5-6 disc herniation. Treatment options were discussed including ACDF. Risks, benefits, and alternatives to surgery were reviewed. After all questions were answered, informed consent was obtained.  PROCEDURE IN DETAIL: The patient was brought to the operating room and transferred to the operative table. After induction of general anesthesia, the patient was positioned on the operative table in the supine position with all pressure points meticulously padded. The skin of the neck was then prepped and draped in the usual sterile fashion.  After timeout was conducted, the skin was infiltrated with local anesthetic. Skin incision was then made sharply and Bovie electrocautery was used to dissect the subcutaneous tissue until the platysma was identified. The platysma was then divided and undermined. The sternocleidomastoid muscle was then identified and, utilizing natural fascial planes in the neck, the prevertebral fascia was identified and  the carotid sheath was retracted laterally and the trachea and esophagus retracted medially. Again using fluoroscopy, the correct disc space was identified. Bovie electrocautery was used to dissect in the subperiosteal plane and elevate the bilateral longus coli muscles. Table mounted retractors were then placed. At this point, the microscope was draped and brought into the field, and the remainder of the case was done under the microscope using microdissecting technique.  The disc space was incised sharply and rongeurs were use to initially complete a discectomy. The high-speed drill was then used to complete discectomy until the posterior annulus was identified and removed and the posterior longitudinal ligament was identified. Using a nerve hook, the PLL was elevated, and Kerrison rongeurs were used to remove the posterior longitudinal ligament and the ventral thecal sac was identified. Using a combination of curettes and rongeurs, complete decompression of the thecal sac and exiting nerve roots at this level was completed. There did appear to be some disc fragments in the right foramen which were removed. Good decompression was confirmed with a dissector.  Having completed our decompression, attention was turned to placement of the intervertebral device. Trial spacers were used to select a 19m73mraft. This graft was then filled with morcellized allograft, and inserted under live fluoroscopy.  After placement of the intervertebral device, the above anterior cervical plate was selected, and placed across the interspace. Using a high-speed drill, the cortex of the cervical vertebral bodies was punctured, and screws inserted in the level above and below. Final fluoroscopic images in lateral projection were taken to confirm good hardware placement.  At this point, after all counts were verified to be correct, meticulous hemostasis was secured using a combination of bipolar electrocautery and passive  hemostatics. The platysma muscle was then closed using interrupted 3-0 Vicryl sutures, and  the skin was closed with an interrupted 3-0 Vicry subcuticular stitch. Dermabond and sterile dressings were then applied and the drapes removed.  The patient tolerated the procedure well and was extubated in the room and taken to the postanesthesia care unit in stable condition.

## 2016-05-30 NOTE — Anesthesia Preprocedure Evaluation (Addendum)
Anesthesia Evaluation  Patient identified by MRN, date of birth, ID band Patient awake    Reviewed: Allergy & Precautions, NPO status , Patient's Chart, lab work & pertinent test results  History of Anesthesia Complications Negative for: history of anesthetic complications  Airway Mallampati: II  TM Distance: <3 FB Neck ROM: Full    Dental  (+) Teeth Intact, Partial Upper, Dental Advisory Given   Pulmonary neg pulmonary ROS, former smoker,  Cough left over from recent URI   breath sounds clear to auscultation       Cardiovascular negative cardio ROS   Rhythm:Regular Rate:Normal     Neuro/Psych negative neurological ROS     GI/Hepatic Neg liver ROS, GERD  ,  Endo/Other  negative endocrine ROS  Renal/GU negative Renal ROS     Musculoskeletal  (+) Arthritis ,   Abdominal   Peds  Hematology negative hematology ROS (+)   Anesthesia Other Findings   Reproductive/Obstetrics                           Anesthesia Physical Anesthesia Plan  ASA: I  Anesthesia Plan: General   Post-op Pain Management:    Induction: Intravenous  Airway Management Planned: Oral ETT  Additional Equipment:   Intra-op Plan:   Post-operative Plan: Extubation in OR  Informed Consent: I have reviewed the patients History and Physical, chart, labs and discussed the procedure including the risks, benefits and alternatives for the proposed anesthesia with the patient or authorized representative who has indicated his/her understanding and acceptance.   Dental advisory given  Plan Discussed with: CRNA  Anesthesia Plan Comments:         Anesthesia Quick Evaluation

## 2016-05-31 ENCOUNTER — Encounter (HOSPITAL_COMMUNITY): Payer: Self-pay | Admitting: Neurosurgery

## 2016-05-31 DIAGNOSIS — M4722 Other spondylosis with radiculopathy, cervical region: Secondary | ICD-10-CM | POA: Diagnosis not present

## 2016-05-31 NOTE — Progress Notes (Signed)
Patient alert and oriented, mae's well, voiding adequate amount of urine, swallowing without difficulty, c/o moderate pain and meds given prior to discharged for ride and discomfort. Patient discharged home with family. Script and discharged instructions given to patient. Patient and family stated understanding of instructions given.   

## 2016-06-03 NOTE — Anesthesia Postprocedure Evaluation (Addendum)
Anesthesia Post Note  Patient: Patrizia L Reasner  Procedure(s) Performed: Procedure(s) (LRB): ANTERIOR CERVICAL DECOMPRESSION/DISCECTOMY FUSION  CERVICAL FIVE- CERVICAL SIX (N/A)  Patient location during evaluation: PACU Anesthesia Type: General Level of consciousness: awake and alert Pain management: pain level controlled Vital Signs Assessment: post-procedure vital signs reviewed and stable Respiratory status: spontaneous breathing, nonlabored ventilation, respiratory function stable and patient connected to nasal cannula oxygen Cardiovascular status: blood pressure returned to baseline and stable Postop Assessment: no signs of nausea or vomiting Anesthetic complications: no       Last Vitals:  Vitals:   05/31/16 0415 05/31/16 0803  BP: (!) 106/58 131/70  Pulse: 83 88  Resp: 18 18  Temp: 36.8 C 36.7 C    Last Pain:  Vitals:   05/31/16 0525  TempSrc:   PainSc: 3                  Shalona Harbour,JAMES TERRILL

## 2016-10-25 NOTE — Addendum Note (Signed)
Addendum  created 10/25/16 1114 by Julane Crock, MD   Sign clinical note    

## 2018-08-26 IMAGING — RF DG CERVICAL SPINE 1V
1 series · 1 of 1 positions shown · non-contrast
Comparison: Same day.

CLINICAL DATA: Anterior cervical disc fusion of C5-6.

EXAM:
CERVICAL SPINE 1 VIEW
Fluoroscopy time 4 seconds.

[Series 1: run · 1 of 1 slices shown]
[im 1/1]
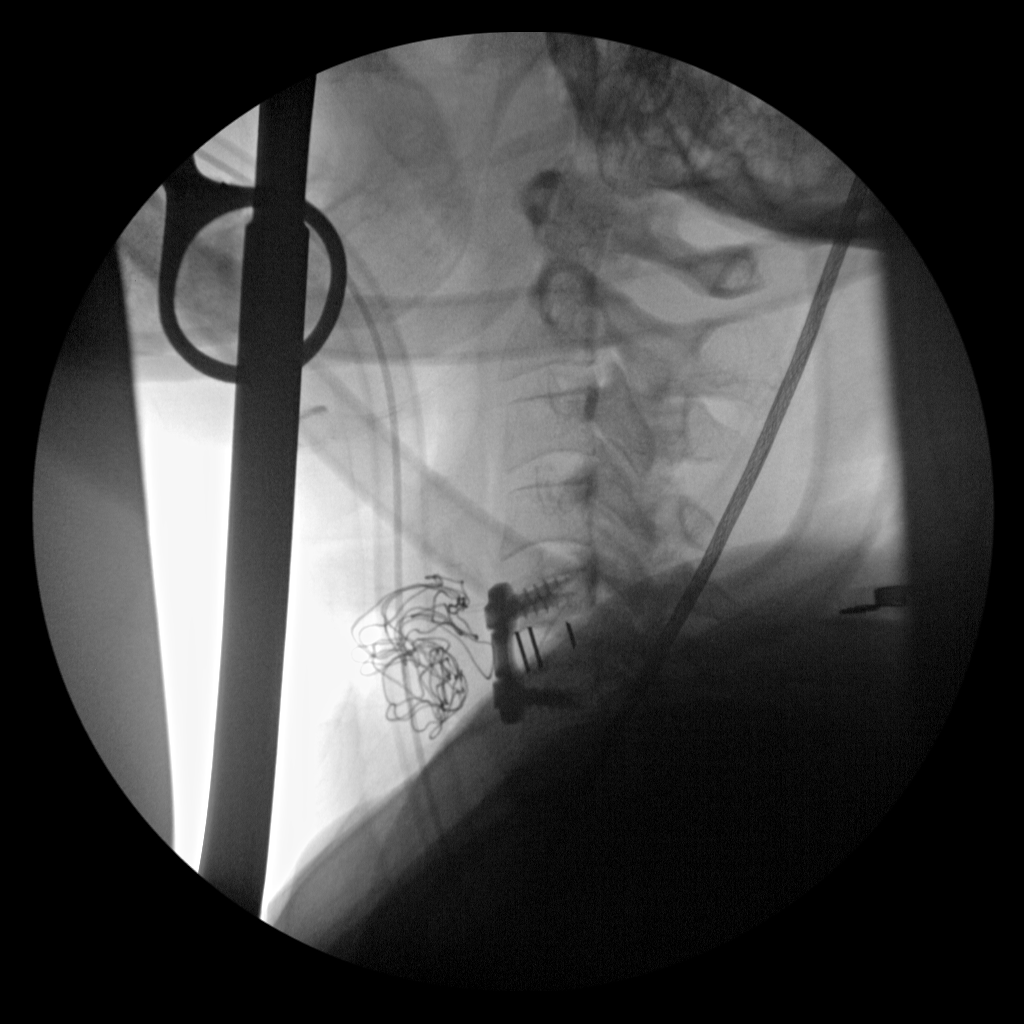

[1 of 1 positions shown; findings below may reference images not displayed]

FINDINGS: Single lateral intraoperative fluoroscopic image of the cervical
spine demonstrates the patient be status post surgical anterior
fusion of C5-6 with interbody fusion. Good alignment of vertebral
bodies is noted.
IMPRESSION: Status post surgical anterior fusion of C5-6.

## 2018-11-05 ENCOUNTER — Encounter: Payer: Self-pay | Admitting: Gastroenterology

## 2023-07-02 DIAGNOSIS — R7881 Bacteremia: Secondary | ICD-10-CM | POA: Diagnosis not present

## 2024-01-31 DIAGNOSIS — R079 Chest pain, unspecified: Secondary | ICD-10-CM | POA: Diagnosis not present
